# Patient Record
Sex: Male | Born: 1985 | Hispanic: Yes | Marital: Single
Health system: Southern US, Community
[De-identification: ages and names within clinical notes are randomized; demographics above are authoritative.]

---

## 2020-09-26 ENCOUNTER — Emergency Department
Admission: EM | Admit: 2020-09-26 | Discharge: 2020-09-26 | Disposition: A | Discharge status: Home / Self Care | Payer: Worker's Compensation | Attending: Emergency Medicine | Admitting: Emergency Medicine

## 2020-09-26 ENCOUNTER — Other Ambulatory Visit: Payer: Self-pay

## 2020-09-26 ENCOUNTER — Encounter: Payer: Self-pay | Admitting: Physician Assistant

## 2020-09-26 ENCOUNTER — Emergency Department: Payer: Worker's Compensation

## 2020-09-26 DIAGNOSIS — S99921A Unspecified injury of right foot, initial encounter: Secondary | ICD-10-CM | POA: Diagnosis present

## 2020-09-26 DIAGNOSIS — Y990 Civilian activity done for income or pay: Secondary | ICD-10-CM | POA: Diagnosis not present

## 2020-09-26 DIAGNOSIS — W208XXA Other cause of strike by thrown, projected or falling object, initial encounter: Secondary | ICD-10-CM | POA: Insufficient documentation

## 2020-09-26 DIAGNOSIS — S9031XA Contusion of right foot, initial encounter: Secondary | ICD-10-CM | POA: Insufficient documentation

## 2020-09-26 DIAGNOSIS — T1490XA Injury, unspecified, initial encounter: Secondary | ICD-10-CM

## 2020-09-26 NOTE — Discharge Instructions (Addendum)
Wear the boot for walking. Rest with the foot elevated, and apply ice to reduce swelling. Take OTC Motrin/Ibuprofen for pain. Follow-up with Dr. Ether Griffins if you have worsening pain.   Use la bota para caminar. Descanse con el pie elevado y aplique hielo para reducir la hinchazn. Tome Motrin/ibuprofeno de venta libre para Chief Technology Officer. Haga un seguimiento con el Dr. Ether Griffins si el dolor empeora.

## 2020-09-26 NOTE — ED Notes (Signed)
No worker's comp profile listed in computer.

## 2020-09-26 NOTE — ED Provider Notes (Signed)
[facility] Emergency Department Provider Note ____________________________________________  Time seen: 2157  I have reviewed the triage vital signs and the nursing notes.  HISTORY  Chief Complaint  Foot Injury   HPI Christian Herman is a 35 y.o. male presents himself to the ED for evaluation of pain to his right foot.  He describes an injury where a large rock fell into his medial  right foot while at work.  Patient is ambulatory with an antalgic gait, and notes that the injury occurred at his place of employment.  He works for a [employer].  He denies any other injury at this time. History reviewed. No pertinent past medical history.  There are no problems to display for this patient.   History reviewed. No pertinent surgical history.  Prior to Admission medications   Not on File    Allergies Patient has no known allergies.  History reviewed. No pertinent family history.  Social History    Review of Systems  Constitutional: Negative for fever. Cardiovascular: Negative for chest pain. Respiratory: Negative for shortness of breath. Gastrointestinal: Negative for abdominal pain, vomiting and diarrhea. Genitourinary: Negative for dysuria. Musculoskeletal: Negative for back pain.  Right foot and ankle pain as above. Skin: Negative for rash. Neurological: Negative for headaches, focal weakness or numbness. ____________________________________________  PHYSICAL EXAM:  VITAL SIGNS: ED Triage Vitals  Enc Vitals Group     BP 09/26/20 2144 (!) 136/102     Pulse Rate 09/26/20 2144 95     Resp 09/26/20 2144 16     Temp 09/26/20 2144 98.8 F (37.1 C)     Temp Source 09/26/20 2144 Oral     SpO2 09/26/20 2144 100 %     Weight 09/26/20 2149 205 lb (93 kg)     Height 09/26/20 2149 6\' 1"  (1.854 m)     Head Circumference --      Peak Flow --      Pain Score --      Pain Loc --      Pain Edu? --      Excl. in GC? --     Constitutional:  Alert and oriented. Well appearing and in no distress. Head: Normocephalic and atraumatic. Cardiovascular: Normal rate, regular rhythm. Normal distal pulses. Respiratory: Normal respiratory effort. No wheezes/rales/rhonchi. Musculoskeletal: Right foot and ankle without obvious deformity or dislocation.  Patient does have some significant soft tissue swelling to the medial aspect of the ankle, as well as some early ecchymosis to the medial foot.  Normal active range of motion of the ankle and toes distally.  Nontender with normal range of motion in all extremities.  Neurologic:  Normal gait without ataxia. Normal speech and language. No gross focal neurologic deficits are appreciated. ____________________________________________   RADIOLOGY  DG Right Ankle  IMPRESSION: 1. Soft tissue edema. No acute fracture or subluxation. 2. Accessory ossicle versus sequela of remote prior injury just distal to the fibular tip.   DG Right Foot  IMPRESSION: Soft tissue edema. No acute fracture or subluxation of the right foot.  ____________________________________________  PROCEDURES  CAM boot  Procedures ____________________________________________   INITIAL IMPRESSION / ASSESSMENT AND PLAN / ED COURSE  As part of my medical decision making, I reviewed the following data within the electronic MEDICAL RECORD NUMBER Radiograph reviewed NAD and Notes from prior ED visits    DDX: foot contusion, ankle fracture, hematoma    ED evaluation of acute medial ankle pain after work-related injury.  He describes a rock  falling onto his ankle while at work.  He was evaluated for his complaints, and found to have a negative x-rays of the ankle and foot respectively.  He placed in a cam boot due to significant edema and disability.  He will be referred to podiatry for ongoing symptoms. A work note is provided for 3 days.   Christian Herman was evaluated in Emergency Department on 09/26/2020 for the symptoms  described in the history of present illness. He was evaluated in the context of the global COVID-19 pandemic, which necessitated consideration that the patient might be at risk for infection with the SARS-CoV-2 virus that causes COVID-19. Institutional protocols and algorithms that pertain to the evaluation of patients at risk for COVID-19 are in a state of rapid change based on information released by regulatory bodies including the CDC and federal and state organizations. These policies and algorithms were followed during the patient's care in the ED. ____________________________________________  FINAL CLINICAL IMPRESSION(S) / ED DIAGNOSES  Final diagnoses:  Injury  Contusion of right foot, initial encounter      Lissa Hoard, PA-C 09/26/20 2254    Sharyn Creamer, MD 09/26/20 2319

## 2020-09-26 NOTE — ED Notes (Signed)
L up ortho info provided , cam walking boot applied all info provided

## 2020-09-26 NOTE — ED Triage Notes (Addendum)
Information obtained with assist of spanish interpreter Rafeal,  with [facility].

## 2020-09-26 NOTE — ED Triage Notes (Signed)
Pt states he is here for an xray, pt states a rock fell on his right foot today. Pt is ambulatory without difficulty. Pt states is under worker's comp works for [employer].

## 2022-12-18 IMAGING — DX DG FOOT COMPLETE 3+V*R*
3 series · 3 of 3 positions shown · non-contrast
Comparison: None.

CLINICAL DATA: Injury. Rock fell on foot today. Right foot and
ankle swelling.

EXAM:
RIGHT FOOT COMPLETE - 3+ VIEW

[foot ap]
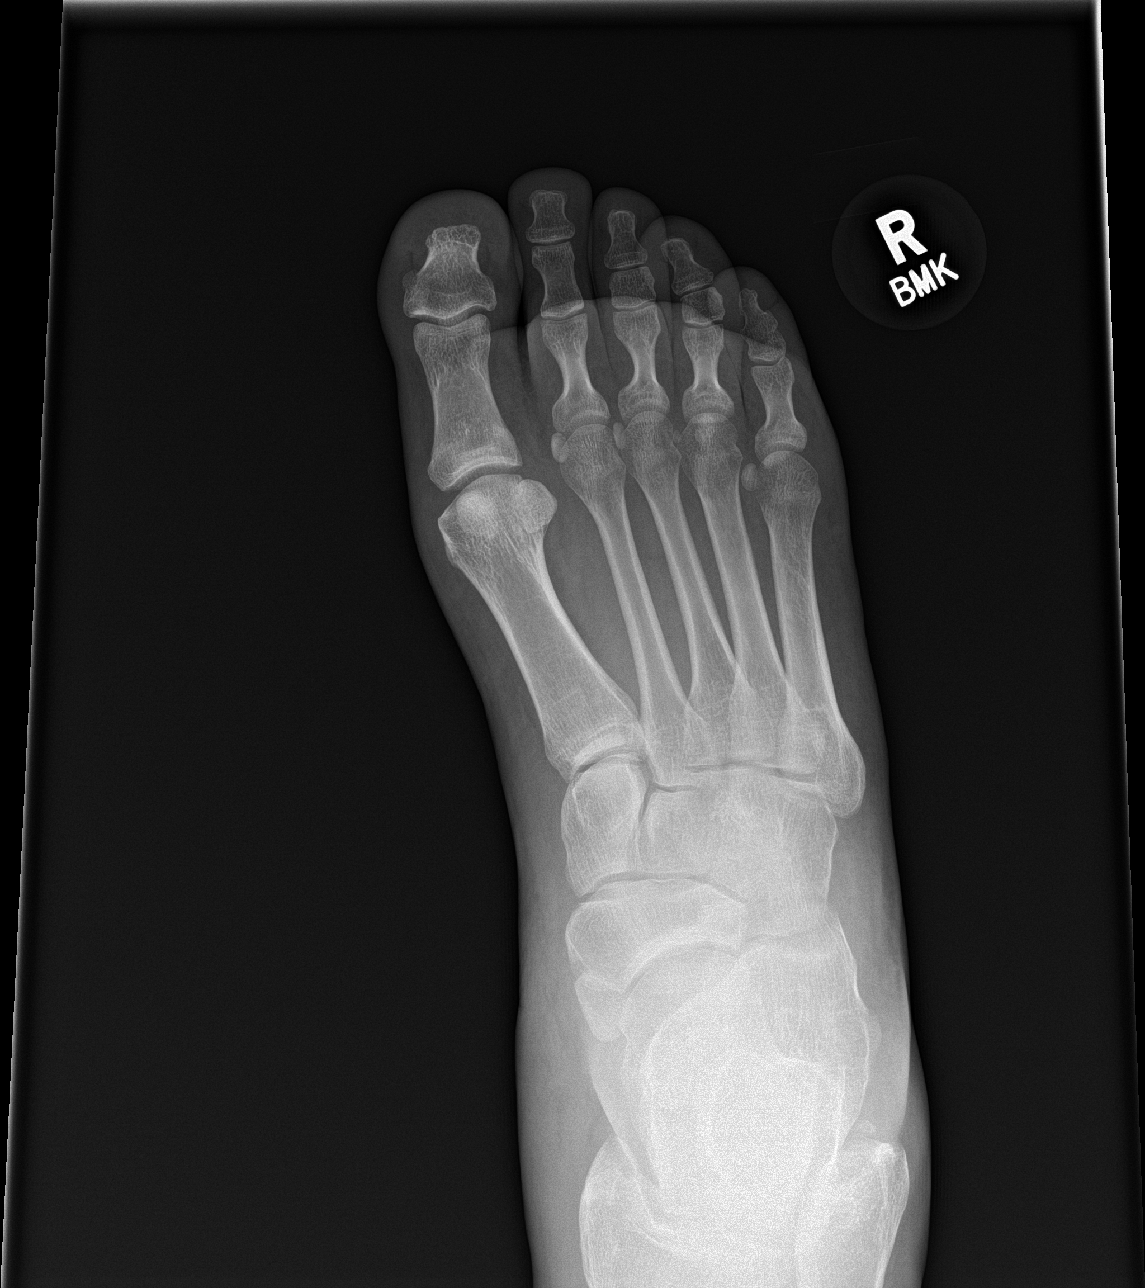

[foot obl]
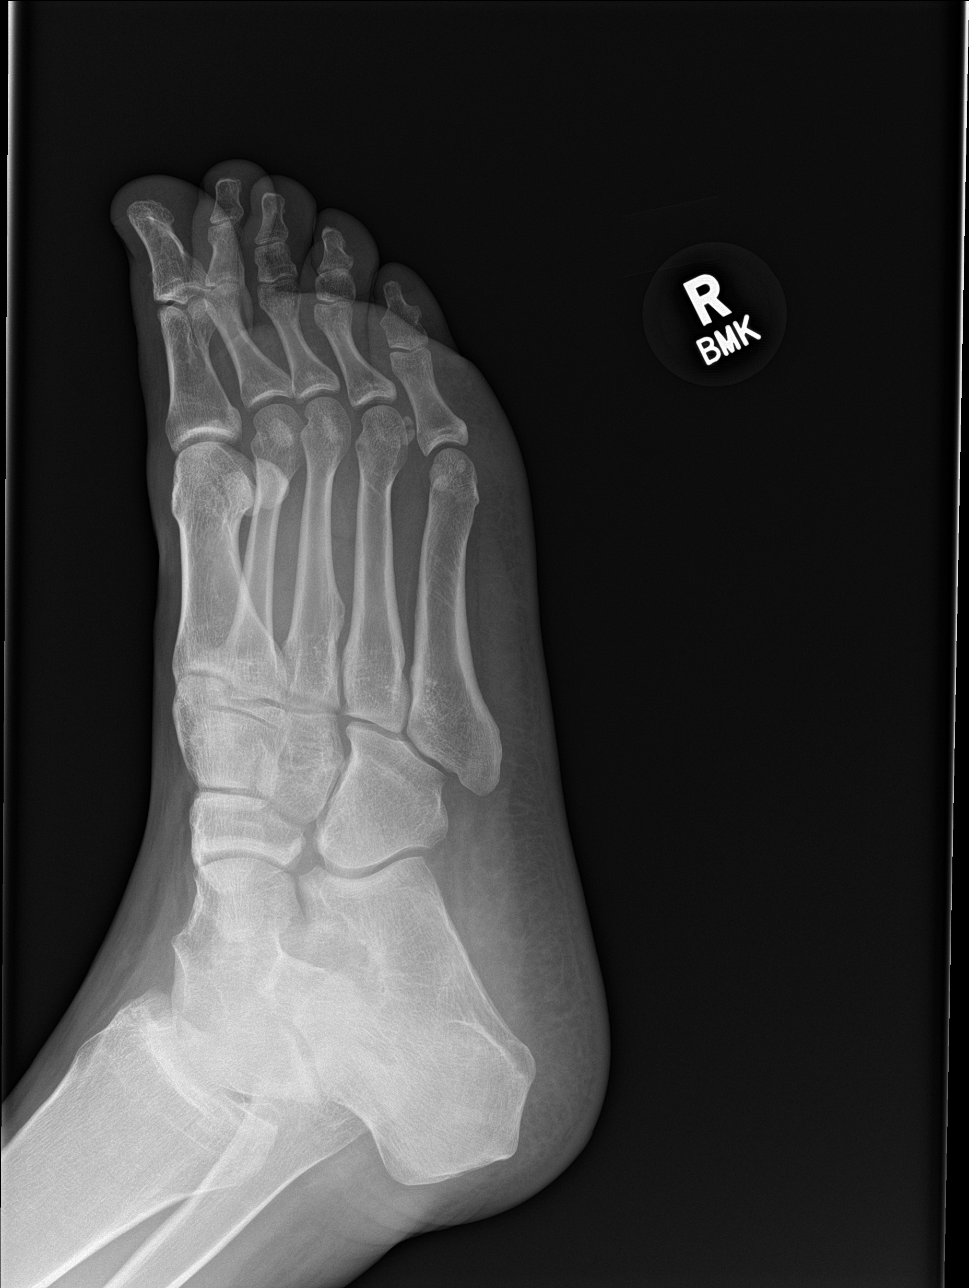

[foot lat]
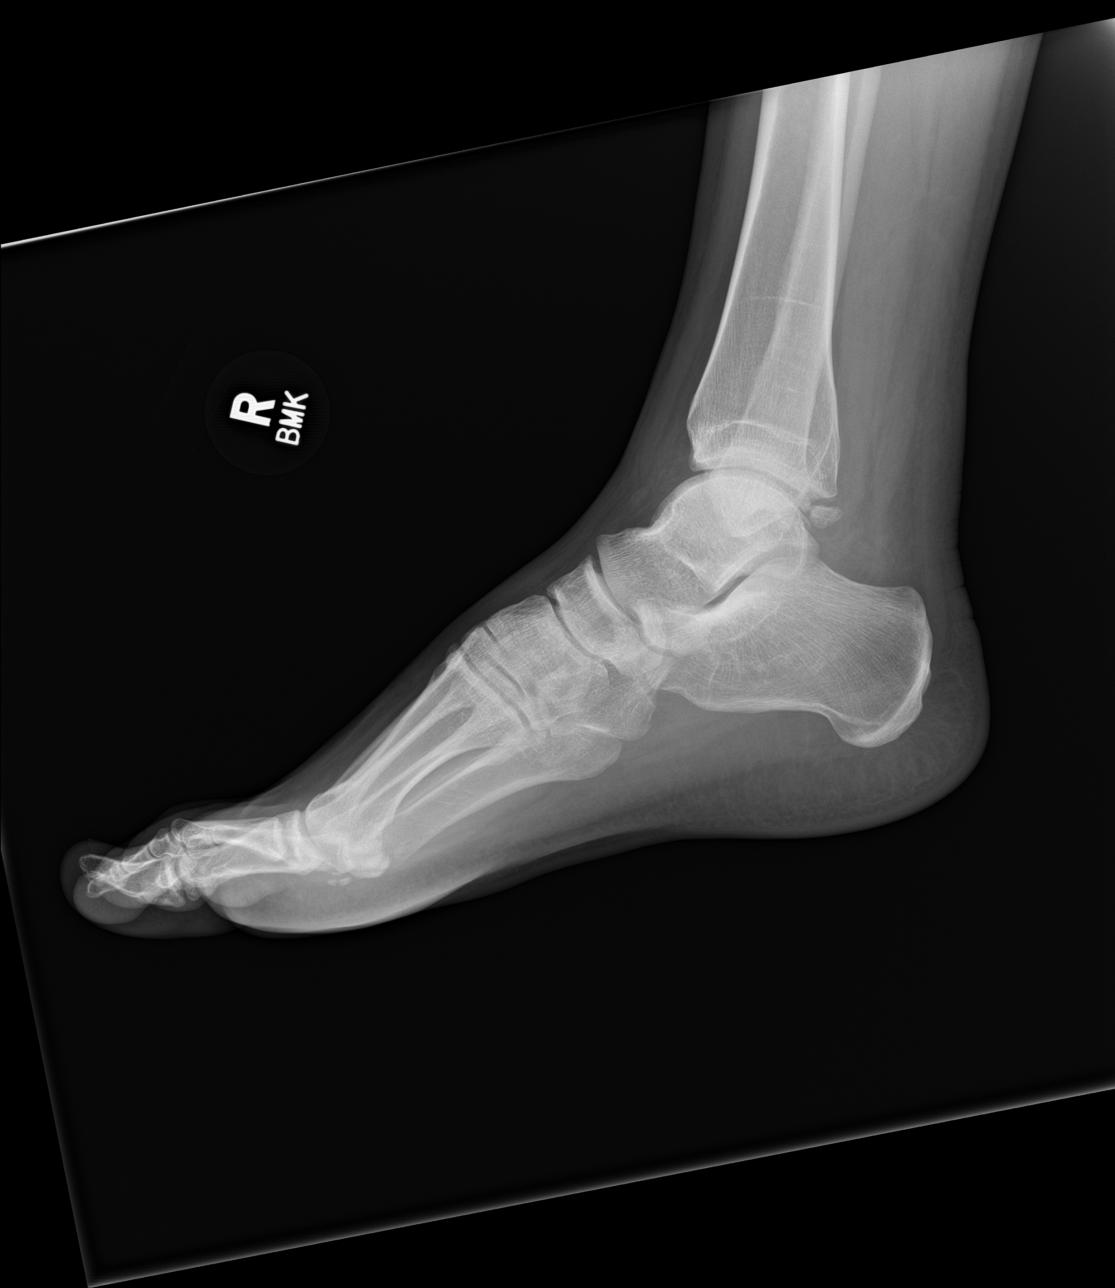

[3 of 3 positions shown; findings below may reference images not displayed]

FINDINGS: There is no evidence of fracture or dislocation. There is no
evidence of arthropathy or other focal bone abnormality. Normal
alignment and joint spaces. There are multiple ossicles at the
metatarsal phalangeal joints, including a bipartite ossicle about
the fourth MTP. Mild soft tissue edema about the mid and hindfoot.
IMPRESSION: Soft tissue edema. No acute fracture or subluxation of the right
foot.
